# Patient Record
Sex: Female | Born: 2005
Health system: Southern US, Community
[De-identification: ages and names within clinical notes are randomized; demographics above are authoritative.]

## PROBLEM LIST (undated history)

## (undated) DIAGNOSIS — L719 Rosacea, unspecified: Secondary | ICD-10-CM

## (undated) DIAGNOSIS — F4541 Pain disorder exclusively related to psychological factors: Secondary | ICD-10-CM

## (undated) DIAGNOSIS — J45909 Unspecified asthma, uncomplicated: Secondary | ICD-10-CM

## (undated) HISTORY — DX: Pain disorder exclusively related to psychological factors: F45.41

## (undated) HISTORY — DX: Rosacea, unspecified: L71.9

## (undated) HISTORY — DX: Unspecified asthma, uncomplicated: J45.909

---
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
## 2023-01-01 DIAGNOSIS — F411 Generalized anxiety disorder: Secondary | ICD-10-CM | POA: Diagnosis not present

## 2023-01-07 ENCOUNTER — Other Ambulatory Visit (HOSPITAL_COMMUNITY): Payer: Self-pay

## 2023-01-07 MED ORDER — METHYLPHENIDATE HCL ER 18 MG PO TB24
18.0000 mg | ORAL_TABLET | Freq: Every day | ORAL | 0 refills | Status: DC
Start: 2023-01-07 — End: 2023-03-19
  Filled 2023-01-07: qty 30, 30d supply, fill #0

## 2023-01-22 ENCOUNTER — Other Ambulatory Visit (HOSPITAL_COMMUNITY): Payer: Self-pay

## 2023-01-25 DIAGNOSIS — F411 Generalized anxiety disorder: Secondary | ICD-10-CM | POA: Diagnosis not present

## 2023-02-04 ENCOUNTER — Other Ambulatory Visit (HOSPITAL_COMMUNITY): Payer: Self-pay

## 2023-02-08 ENCOUNTER — Other Ambulatory Visit (HOSPITAL_COMMUNITY): Payer: Self-pay

## 2023-02-11 ENCOUNTER — Other Ambulatory Visit: Payer: Self-pay

## 2023-02-26 DIAGNOSIS — F411 Generalized anxiety disorder: Secondary | ICD-10-CM | POA: Diagnosis not present

## 2023-03-12 DIAGNOSIS — F411 Generalized anxiety disorder: Secondary | ICD-10-CM | POA: Diagnosis not present

## 2023-03-18 ENCOUNTER — Other Ambulatory Visit (HOSPITAL_COMMUNITY): Payer: Self-pay

## 2023-03-19 ENCOUNTER — Other Ambulatory Visit (HOSPITAL_COMMUNITY): Payer: Self-pay

## 2023-03-19 ENCOUNTER — Encounter (HOSPITAL_COMMUNITY): Payer: Self-pay

## 2023-03-19 MED ORDER — METHYLPHENIDATE HCL ER (OSM) 18 MG PO TBCR
18.0000 mg | EXTENDED_RELEASE_TABLET | Freq: Every day | ORAL | 0 refills | Status: DC
Start: 2023-03-19 — End: 2023-09-18
  Filled 2023-03-19: qty 30, 30d supply, fill #0

## 2023-03-19 MED ORDER — FLUTICASONE PROPIONATE HFA 44 MCG/ACT IN AERO
1.0000 | INHALATION_SPRAY | Freq: Two times a day (BID) | RESPIRATORY_TRACT | 3 refills | Status: DC
  Filled 2023-03-19: qty 10.6, 60d supply, fill #0

## 2023-03-19 MED ORDER — ALBUTEROL SULFATE (2.5 MG/3ML) 0.083% IN NEBU
3.0000 mL | INHALATION_SOLUTION | RESPIRATORY_TRACT | 2 refills | Status: AC | PRN
  Filled 2023-03-19: qty 300, 17d supply, fill #0

## 2023-03-20 ENCOUNTER — Other Ambulatory Visit (HOSPITAL_COMMUNITY): Payer: Self-pay

## 2023-03-20 MED ORDER — ALBUTEROL SULFATE HFA 108 (90 BASE) MCG/ACT IN AERS
2.0000 | INHALATION_SPRAY | RESPIRATORY_TRACT | 2 refills | Status: AC | PRN
  Filled 2023-03-20: qty 6.7, 17d supply, fill #0

## 2023-03-26 DIAGNOSIS — F411 Generalized anxiety disorder: Secondary | ICD-10-CM | POA: Diagnosis not present

## 2023-04-16 DIAGNOSIS — T8332XA Displacement of intrauterine contraceptive device, initial encounter: Secondary | ICD-10-CM | POA: Diagnosis not present

## 2023-04-16 DIAGNOSIS — N921 Excessive and frequent menstruation with irregular cycle: Secondary | ICD-10-CM | POA: Diagnosis not present

## 2023-04-16 DIAGNOSIS — Z975 Presence of (intrauterine) contraceptive device: Secondary | ICD-10-CM | POA: Diagnosis not present

## 2023-04-23 DIAGNOSIS — F411 Generalized anxiety disorder: Secondary | ICD-10-CM | POA: Diagnosis not present

## 2023-04-30 DIAGNOSIS — F411 Generalized anxiety disorder: Secondary | ICD-10-CM | POA: Diagnosis not present

## 2023-05-07 DIAGNOSIS — F411 Generalized anxiety disorder: Secondary | ICD-10-CM | POA: Diagnosis not present

## 2023-05-10 ENCOUNTER — Other Ambulatory Visit (HOSPITAL_BASED_OUTPATIENT_CLINIC_OR_DEPARTMENT_OTHER): Payer: Self-pay

## 2023-05-11 ENCOUNTER — Other Ambulatory Visit (HOSPITAL_COMMUNITY): Payer: Self-pay

## 2023-05-13 ENCOUNTER — Other Ambulatory Visit (HOSPITAL_COMMUNITY): Payer: Self-pay

## 2023-05-13 MED ORDER — METHYLPHENIDATE HCL ER (OSM) 18 MG PO TBCR
18.0000 mg | EXTENDED_RELEASE_TABLET | Freq: Every day | ORAL | 0 refills | Status: DC
  Filled 2023-05-13: qty 30, 30d supply, fill #0

## 2023-05-14 ENCOUNTER — Other Ambulatory Visit (HOSPITAL_BASED_OUTPATIENT_CLINIC_OR_DEPARTMENT_OTHER): Payer: Self-pay

## 2023-05-14 ENCOUNTER — Other Ambulatory Visit (HOSPITAL_COMMUNITY): Payer: Self-pay

## 2023-05-14 DIAGNOSIS — F411 Generalized anxiety disorder: Secondary | ICD-10-CM | POA: Diagnosis not present

## 2023-06-04 DIAGNOSIS — F411 Generalized anxiety disorder: Secondary | ICD-10-CM | POA: Diagnosis not present

## 2023-06-07 ENCOUNTER — Other Ambulatory Visit (HOSPITAL_BASED_OUTPATIENT_CLINIC_OR_DEPARTMENT_OTHER): Payer: Self-pay

## 2023-06-18 DIAGNOSIS — F411 Generalized anxiety disorder: Secondary | ICD-10-CM | POA: Diagnosis not present

## 2023-07-01 DIAGNOSIS — F411 Generalized anxiety disorder: Secondary | ICD-10-CM | POA: Diagnosis not present

## 2023-07-18 DIAGNOSIS — F411 Generalized anxiety disorder: Secondary | ICD-10-CM | POA: Diagnosis not present

## 2023-07-22 ENCOUNTER — Other Ambulatory Visit (HOSPITAL_COMMUNITY): Payer: Self-pay

## 2023-07-22 ENCOUNTER — Other Ambulatory Visit: Payer: Self-pay

## 2023-07-22 ENCOUNTER — Encounter (HOSPITAL_COMMUNITY): Payer: Self-pay

## 2023-07-22 ENCOUNTER — Ambulatory Visit: Admitting: Internal Medicine

## 2023-07-29 ENCOUNTER — Ambulatory Visit: Admitting: Internal Medicine

## 2023-08-09 DIAGNOSIS — F411 Generalized anxiety disorder: Secondary | ICD-10-CM | POA: Diagnosis not present

## 2023-09-06 ENCOUNTER — Telehealth: Payer: Self-pay | Admitting: Family Medicine

## 2023-09-06 NOTE — Telephone Encounter (Signed)
 Copied from CRM [id number]. Topic: Appointments - Scheduling Inquiry for Clinic >> Sep 03, 2023  4:44 PM Rea C wrote: Reason for CRM: Patient called in inquiring to be scheduled with Dr. Annabelle RONAL Fetters. I asked patient if they have family as an existing patient but stated that their family member works in the [employer], and stated the request to be appointed is a favor from the provider.   Patient contact is [phone] (M).  Appointment scheduled

## 2023-09-17 NOTE — Progress Notes (Unsigned)
 No chief complaint on file.  Patient presents to establish care.     Available prior records reviewed in Epic, CareEverywhere--  05/29/22 Mirena  IUD (req cytotec  prior, failed initial attempt) ADHD (inattentive) and generalized anxiety (noted on psych testing 01/2022) Started on Sertraline  06/2021 ADD meds started 02/2022. 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 Dorita  Therapist: Tinnie Domino Psych testing done by Dr. Loel 01/2022.    Immunization History  Administered Date(s) Administered   DTaP 03/27/2004, 05/30/2004, 08/09/2004, 06/08/2005   HIB (PRP-OMP) 03/27/2004, 05/30/2004, 06/08/2005   Hepatitis A 01/10/2005, 01/09/2006   Hepatitis B 11/17/2003, 03/27/2004, 10/25/2004   IPV 03/27/2004, 05/30/2004, 10/25/2004   Influenza Split 11/14/2004, 12/11/2004, 02/16/2012   Influenza,inj,Quad PF,6+ Mos 02/14/2017   MMR 01/10/2005   Pneumococcal Conjugate-13 03/27/2004, 05/30/2004, 08/09/2004, 06/08/2005   Typhoid Inactivated 07/15/2014   Varicella 01/10/2005      ROS:    PHYSICAL EXAM:  There were no vitals taken for this visit.  Wt Readings from Last 3 Encounters:  05/29/22 99 lb (44.9 kg) (4%, Z= -1.79)*  03/30/22 100 lb 12.8 oz (45.7 kg) (6%, Z= -1.60)*  03/05/22 99 lb 3.2 oz (45 kg) (4%, Z= -1.74)*   * Growth percentiles are based on CDC (Girls, 2-20 Years) data.       ASSESSMENT/PLAN:  If we are going to be prescribing psych meds, need phq9 and gad7.  Please check [facility] for other vaccines, seem to be missing some--  Did she get TdaP Any HPV vaccines? Flu shots? COVID vaccines

## 2023-09-18 ENCOUNTER — Ambulatory Visit: Admitting: Family Medicine

## 2023-09-18 ENCOUNTER — Other Ambulatory Visit (HOSPITAL_COMMUNITY): Payer: Self-pay

## 2023-09-18 VITALS — BP 110/60 | HR 64 | Ht 59.0 in | Wt 113.4 lb

## 2023-09-18 DIAGNOSIS — L719 Rosacea, unspecified: Secondary | ICD-10-CM | POA: Diagnosis not present

## 2023-09-18 DIAGNOSIS — F411 Generalized anxiety disorder: Secondary | ICD-10-CM | POA: Diagnosis not present

## 2023-09-18 DIAGNOSIS — J4520 Mild intermittent asthma, uncomplicated: Secondary | ICD-10-CM | POA: Diagnosis not present

## 2023-09-18 DIAGNOSIS — F909 Attention-deficit hyperactivity disorder, unspecified type: Secondary | ICD-10-CM | POA: Diagnosis not present

## 2023-09-18 DIAGNOSIS — F1290 Cannabis use, unspecified, uncomplicated: Secondary | ICD-10-CM | POA: Diagnosis not present

## 2023-09-18 DIAGNOSIS — L989 Disorder of the skin and subcutaneous tissue, unspecified: Secondary | ICD-10-CM

## 2023-09-18 MED ORDER — METHYLPHENIDATE HCL ER (OSM) 18 MG PO TBCR
18.0000 mg | EXTENDED_RELEASE_TABLET | Freq: Every day | ORAL | 0 refills | Status: DC
  Filled 2023-09-18: qty 90, 90d supply, fill #0

## 2023-09-18 MED ORDER — SERTRALINE HCL 50 MG PO TABS
75.0000 mg | ORAL_TABLET | Freq: Every day | ORAL | 0 refills | Status: DC
  Filled 2023-09-18: qty 135, 90d supply, fill #0

## 2023-09-18 NOTE — Patient Instructions (Addendum)
 We discussed trying to increase the sertraline  to 75 mg daily.  This is taking 1.5 tablets once a day. If you find it is more sedating at the higher dose, switch it to taking it in the evening. Contact us  via MyChart in about 4-6 weeks and let us  know how you are doing on it. If no side effects, but no real improvement in anxiety symptoms, we can further increase to 100 mg (you can double up on the pills, and get a new prescription for 100 mg tablets), and then follow-up with us  when you are home for break. Unfortunately we cannot do virtual visit if you are out of state.  Side effects of the higher dose (other than making you sleepy), can include headache, nausea, upset stomach and worsening anxiety, but is usually short-lived (a week or less).  Use warm compresses to the bumps that develop on the eyelids. Try and avoid touching your eyes, and wash hands frequently. Use antibacterial ointment to the area on the left eyelid, which appears slightly inflamed/irritated.   Try and let the skin of your R leg get some air, to allow things to dry up and heal. There is no evidence of infection. Use antibacterial ointment to the open portions of the skin to prevent infection.

## 2023-09-19 ENCOUNTER — Encounter: Payer: Self-pay | Admitting: Family Medicine

## 2023-09-19 DIAGNOSIS — F411 Generalized anxiety disorder: Secondary | ICD-10-CM | POA: Insufficient documentation

## 2023-09-19 DIAGNOSIS — F909 Attention-deficit hyperactivity disorder, unspecified type: Secondary | ICD-10-CM | POA: Insufficient documentation

## 2023-09-19 DIAGNOSIS — F1290 Cannabis use, unspecified, uncomplicated: Secondary | ICD-10-CM | POA: Insufficient documentation

## 2023-09-27 DIAGNOSIS — F411 Generalized anxiety disorder: Secondary | ICD-10-CM | POA: Diagnosis not present

## 2023-10-11 DIAGNOSIS — F411 Generalized anxiety disorder: Secondary | ICD-10-CM | POA: Diagnosis not present

## 2023-10-22 ENCOUNTER — Other Ambulatory Visit (HOSPITAL_COMMUNITY): Payer: Self-pay

## 2023-10-22 MED ORDER — METRONIDAZOLE 0.75 % EX CREA
TOPICAL_CREAM | CUTANEOUS | 10 refills | Status: AC
  Filled 2023-10-22: qty 45, 30d supply, fill #0

## 2023-10-23 ENCOUNTER — Other Ambulatory Visit (HOSPITAL_COMMUNITY): Payer: Self-pay

## 2023-11-04 DIAGNOSIS — F411 Generalized anxiety disorder: Secondary | ICD-10-CM | POA: Diagnosis not present

## 2023-12-06 DIAGNOSIS — F411 Generalized anxiety disorder: Secondary | ICD-10-CM | POA: Diagnosis not present

## 2023-12-16 ENCOUNTER — Other Ambulatory Visit: Payer: Self-pay | Admitting: Family Medicine

## 2023-12-16 DIAGNOSIS — F909 Attention-deficit hyperactivity disorder, unspecified type: Secondary | ICD-10-CM

## 2023-12-16 DIAGNOSIS — F411 Generalized anxiety disorder: Secondary | ICD-10-CM

## 2023-12-17 ENCOUNTER — Other Ambulatory Visit: Payer: Self-pay

## 2023-12-17 ENCOUNTER — Other Ambulatory Visit (HOSPITAL_COMMUNITY): Payer: Self-pay

## 2023-12-17 MED ORDER — METHYLPHENIDATE HCL ER (OSM) 18 MG PO TBCR
18.0000 mg | EXTENDED_RELEASE_TABLET | Freq: Every day | ORAL | 0 refills | Status: AC
  Filled 2023-12-17 – 2023-12-20 (×2): qty 90, 90d supply, fill #0

## 2023-12-17 MED ORDER — SERTRALINE HCL 50 MG PO TABS
75.0000 mg | ORAL_TABLET | Freq: Every day | ORAL | 0 refills | Status: AC
  Filled 2023-12-17: qty 135, 90d supply, fill #0

## 2023-12-17 NOTE — Telephone Encounter (Signed)
 Spoke with patient and she states that she is doing well on 1.5 tablets and does not need to go up to the 100mg .

## 2023-12-17 NOTE — Telephone Encounter (Signed)
 V--looks like you already approved the sertraline . That was a dose change at her last visit. I was supposed to be hearing from her to know how she is doing on the higher dose (increased from 50 to 75 mg). Please reach out and ask how she is doing on the 1.5 tablets--remind her that we had wanted her to give us  an update 4-6 weeks later after increasing it (assuming that she even went up on the dose).  Methylphenidate  was refilled

## 2023-12-17 NOTE — Telephone Encounter (Signed)
 Is this okay to refill?

## 2023-12-18 ENCOUNTER — Other Ambulatory Visit (HOSPITAL_COMMUNITY): Payer: Self-pay

## 2023-12-20 ENCOUNTER — Other Ambulatory Visit (HOSPITAL_COMMUNITY): Payer: Self-pay

## 2023-12-30 DIAGNOSIS — F411 Generalized anxiety disorder: Secondary | ICD-10-CM | POA: Diagnosis not present

## 2024-01-14 ENCOUNTER — Encounter: Payer: Self-pay | Admitting: *Deleted

## 2024-01-15 NOTE — Progress Notes (Signed)
 Chief Complaint  Patient presents with   Annual Exam    Fasting annual exam. Does not see GYN. Does not need STD screening, has been screened since last partner. Believes that she labs drawn, unsure where/when. Had flu and covid at school. Wants to discuss prevnar with you. [name] getting stretch marks she would to discuss.   Lisa Reid is a 18 y.o. adult who is here for well care, and follow-up on chronic issues.    [name] concerned about weight gain, notes some stretch marks on her thighs. [name] trying to eat better.  Worried about her diet, weight, overall health. Asking about apps to track macros, doesn't want to track calories.  05/29/22 Mirena  IUD (required cytotec  prior, failed initial attempt). She doesn't check her strings. She saw a doctor at school who couldn't visualize the strings, sent her for US  and stated position was normal. She doesn't have a local GYN She has been sexually active with males (and female), uses condoms. No new partners since last STD check (GC/chlamydia, I don't see any documentation of blood tests). She denies pelvic pain. She has some periodic spotting, fairly regularly   ADHD (inattentive) and generalized anxiety (noted on psych testing 01/2022) Started on Sertraline  06/2021. ADD meds started 02/2022.  She didn't take methylphenidate  over the summer.  She got behind in her classes last year when she tried going with the medication. She currently reports that the medication is effective, helps during classes, and she gets her homework done in the afternoons. Doesn't have a need for any short-acting medication for evenings. She reports her grades are good this semester. Denies side effects to medication--no insomnia, tics, weight loss. Last fills 12/20/23 and 09/18/23 for #90.  Anxiety--She had been on 50 mg dose of sertraline  for over a year.  PHQ-9 score was 8 and GAD-7 score was 7 at her visit in August. She had reported using cannabinoid edibles, often  used alone (not socially), to wind down at night. Discussed that trouble relaxing could be related to anxiety, and we had discussed increasing sertraline  dose to 75. She increased dose after her August visit. She feels better on the 75 mg dose.  Moods are also better due to being back at school with her good friends. [name] only using the gummies just once a week, much less.  Asthma--Currently she uses albuterol  prior to exercise, and prn. Uses albuterol  and pulmicort  nebulizer when she is sick. Last spirometry was 02/2018. She hasn't needed any rescue inhaler recently.    Other doctors: Therapist: Tinnie Domino Psych testing done by Dr. Loel 01/2022. Dentist: Caron Edison  Nutrition/Eating Behaviors: this year she is eating more fruits and vegetables. At school-- Breakfast: she has hash brown or other potato, veggie sausage, yogurt with berries when they have it, plus another carb (ie muffin) Lunch: PB&J on whole wheat bread with a banana. Pork once or twice a week, mostly chicken. Favorite vegetable is broccoli, snacks on baby carrots frequently.  She started eating salads this break. Eats a lot of cheese (especially when home).  Calcium in diet: yogurt 4-6x/week. Eats a lot cheese. Milk in her tea.  Doesn't tolerate regular milk. Has access to alternative milks at school, hasn't tried.  Supplements/ Vitamins: none   Immunization History  Administered Date(s) Administered   DTaP 03/27/2004, 05/30/2004, 08/09/2004, 06/08/2005, 09/14/2009   HIB (PRP-OMP) 03/27/2004, 05/30/2004, 06/08/2005   HPV 9-valent 01/17/2015, 07/20/2015   Hepatitis A 01/10/2005, 01/09/2006   Hepatitis A,  Ped/Adol-2 Dose 01/10/2005, 01/10/2006   Hepatitis B 28-Nov-2003, 03/27/2004, 10/25/2004   Hepatitis B, PED/ADOLESCENT 03/19/2003, 03/27/2004, 10/25/2004   IPV 03/27/2004, 05/30/2004, 10/25/2004, 09/14/2009   Influenza Inj Mdck Quad Pf 01/10/2018   Influenza Split 11/14/2004, 12/11/2004, 02/16/2012    Influenza,inj,Quad PF,6+ Mos 02/14/2017   Influenza-Unspecified 03/18/2022, 10/10/2022, 11/21/2023   MMR 01/10/2005, 09/14/2009   MenQuadfi_Meningococcal Groups ACYW Conjugate 01/17/2015, 04/26/2020   Meningococcal B, OMV 04/26/2020, 04/26/2020, 06/09/2021, 06/09/2021   PFIZER(Purple Top)SARS-COV-2 Vaccination 06/25/2019, 07/16/2019, 02/04/2020   Pfizer Fall 2024 Covid-19 Vaccine 6mos thru 19yrs 03/18/2022, 10/10/2022   Pfizer(Comirnaty)Fall Seasonal Vaccine 12 years and older 11/21/2023   Pneumococcal Conjugate-13 03/27/2004, 05/30/2004, 08/09/2004, 06/08/2005   Tdap 01/17/2015   Typhoid Inactivated 07/15/2014   Varicella 01/10/2005, 09/14/2009   Last Pap smear: never Last mammogram: never Last colonoscopy: never Last DEXA: never Dentist: twice a year Ophtho: none Exercise:  varies--better at the beginning of the semester, less during finals. Walks or bikes to class (small campus--no more than 5 minutes).  Lives on 4th floor--taking stairs more frequently. She plays hockey in the winter--10 weeks (practice 2x/week plus games)    PMH, PSH, SH and FH were reviewed and updated  Outpatient Encounter Medications as of 01/16/2024  Medication Sig Note   albuterol  (PROVENTIL ) (2.5 MG/3ML) 0.083% nebulizer solution Take 3 mLs by nebulization every 4 (four) hours as needed for wheezing. 01/16/2024: As needed   albuterol  (VENTOLIN  HFA) 108 (90 Base) MCG/ACT inhaler Inhale 2 puffs into the lungs every 4 to 6 hours as needed for cough/wheeze. 01/16/2024: As needed, prior to working out   budesonide  (PULMICORT ) 0.5 MG/2ML nebulizer solution USE 1 VIAL VIA NEBULIZER ONCE OR TWICE DAILY 01/16/2024: As needed   calcium carbonate (TUMS - DOSED IN MG ELEMENTAL CALCIUM) 500 MG chewable tablet Chew 1 tablet by mouth daily. 01/16/2024: As needed   lactase (LACTAID) 3000 units tablet Take 3,000 Units by mouth 3 (three) times daily with meals. 01/16/2024: As needed   Levonorgestrel  (MIRENA , 52 MG, IU) by  Intrauterine route. 01/16/2024: Inserted 05/2022   methylphenidate  18 MG PO CR tablet Take 1 tablet (18 mg total) by mouth daily.    metroNIDAZOLE  (METROCREAM ) 0.75 % cream Apply on the skin in the morning    sertraline  (ZOLOFT ) 50 MG tablet Take 1&1/2 tablets (75 mg total) by mouth daily.    [DISCONTINUED] albuterol  (PROVENTIL ) (2.5 MG/3ML) 0.083% nebulizer solution INHALE VIA NEBULIZER EVERY 6 HOURS AS NEEDED FOR WHEEZING    beclomethasone (QVAR  REDIHALER) 80 MCG/ACT inhaler Inhale 1 puff into the lungs 2 (two) times daily. (Patient not taking: Reported on 01/16/2024)    QVAR  REDIHALER 80 MCG/ACT inhaler INHALE 1 PUFF BY MOUTH 2 TIMES DAILY (Patient not taking: Reported on 01/16/2024)    No facility-administered encounter medications on file as of 01/16/2024.   No Known Allergies  ROS:  Patient denies anorexia, fever, headaches, vision changes, decreased hearing, URI symptoms, breast concerns, chest pain, palpitations, dizziness, syncope, dyspnea on exertion, cough, swelling, nausea, vomiting, diarrhea, constipation, abdominal pain, melena, hematochezia, indigestion/heartburn, hematuria, incontinence, dysuria, abnormal vaginal bleeding (monthly light spotting, has Mirena ), discharge, odor or itch, genital lesions, joint pains, numbness, tingling, weakness, tremor, suspicious skin lesions, abnormal bleeding/bruising, or enlarged lymph nodes. Weight gain, stretch marks per HPI. Anxiety, improved per HPI See HPI   PHYSICAL EXAM:  BP 104/68   Pulse 76   Ht 4' 11 (1.499 m)   Wt 123 lb 12.8 oz (56.2 kg)   BMI 25.00 kg/m   Wt Readings  from Last 3 Encounters:  01/16/24 123 lb 12.8 oz (56.2 kg)  09/18/23 113 lb 6.4 oz (51.4 kg) (21%, Z= -0.80)*  05/29/22 99 lb (44.9 kg) (4%, Z= -1.79)*   * Growth percentiles are based on CDC (Girls, 2-20 Years) data.    General Appearance:    Alert, cooperative, no distress, appears stated age  Head:    Normocephalic, without obvious abnormality,  atraumatic  Eyes:    PERRL, conjunctiva/corneas clear, EOM's intact, fundi    benign  Ears:    Cerumen bilaterally, limits evaluation of TMs  Nose:   Nares normal, no drainage or sinus tenderness  Throat:   Lips, mucosa, and tongue normal; teeth and gums normal  Neck:   Supple, no lymphadenopathy;  thyroid:  no   enlargement/tenderness/nodules; no carotid   bruit or JVD  Back:    Spine nontender, no curvature, ROM normal, no CVA     tenderness  Lungs:     Clear to auscultation bilaterally without wheezes, rales or     ronchi; respirations unlabored  Chest Wall:    No tenderness or deformity   Heart:    Regular rate and rhythm, S1 and S2 normal, no murmur, rub   or gallop  Breast Exam:    Deferred to GYN  Abdomen:     Soft, non-tender, nondistended, normoactive bowel sounds,    no masses, no hepatosplenomegaly  Genitalia:    Deferred to GYN     Extremities:   No clubbing, cyanosis or edema  Pulses:   2+ and symmetric all extremities  Skin:   Skin color, texture, turgor normal, no rashes or lesions. Skin exam is limited, not changed into gown. Back exam is benign. No stria noted on back/abdomen (thighs not evaluated)  Lymph nodes:   Cervical, supraclavicular, and axillary nodes normal  Neurologic:   CNII-XII intact, normal strength, sensation and gait; reflexes 2+ and symmetric throughout          Psych:   Normal mood, affect, hygiene and grooming.        01/16/2024    3:37 PM 09/18/2023   10:57 AM 09/18/2023   10:53 AM 05/02/2022    8:04 AM 03/05/2022   10:27 AM  Depression screen PHQ 2/9  Decreased Interest 0 1 0 0 0  Down, Depressed, Hopeless 0 0 0 0 0  PHQ - 2 Score 0 1 0 0 0  Altered sleeping 0 1  2 1   Tired, decreased energy 0 3  2 2   Change in appetite 1 0  1 0  Feeling bad or failure about yourself  1 1  1  0  Trouble concentrating 0 1  2 3   Moving slowly or fidgety/restless 0 1  1 3   Suicidal thoughts 0 0     PHQ-9 Score 2 8   9  9    Difficult doing work/chores Not  difficult at all Not difficult at all        Data saved with a previous flowsheet row definition      01/16/2024    3:39 PM 09/18/2023   10:59 AM 05/02/2022    8:04 AM 03/05/2022   10:27 AM  GAD 7 : Generalized Anxiety Score  Nervous, Anxious, on Edge 0 1 3 1   Control/stop worrying 0 1 3 1   Worry too much - different things 0 1 3 1   Trouble relaxing 1 1 2 1   Restless 1 1 3 3   Easily annoyed or irritable 0 1 2 1  Afraid - awful might happen 0 1 2 1   Total GAD 7 Score 2 7 18 9   Anxiety Difficulty Not difficult at all Not difficult at all        ASSESSMENT/PLAN:   Annual physical exam - advised to schedule WWE with GYN, given prior exam not having IUD strings visible - Plan: Pneumococcal conjugate vaccine 20-valent (Prevnar 20), Lipid panel, Comprehensive metabolic panel with GFR, CBC with Differential/Platelet, VITAMIN D  25 Hydroxy (Vit-D Deficiency, Fractures), TSH, HIV Antibody (routine testing w rflx), RPR W/RFLX TO RPR TITER, TREPONEMAL AB, SCREEN AND DIAGNOSIS, Hepatitis C antibody  Attention deficit hyperactivity disorder (ADHD), unspecified ADHD type - doing well on current regimen. No adverse effects  Generalized anxiety disorder - improved with 75 mg sertraline  dose. Cont counseling.  Mild intermittent asthma without complication - [employer]. Since she mainly uses albuterol  preventatively, did not rx today. To let us  know if needing more prn/rescue, and we can rx  Need for pneumococcal 20-valent conjugate vaccination - Plan: Pneumococcal conjugate vaccine 20-valent (Prevnar 20)  Screening for STD (sexually transmitted disease) - no recent partners, but didn't see any serum STD testing. Counseled re: STD prevention, condom use - Plan: HIV Antibody (routine testing w rflx), RPR W/RFLX TO RPR TITER, TREPONEMAL AB, SCREEN AND DIAGNOSIS, Hepatitis C antibody  Medication monitoring encounter - Plan: Comprehensive metabolic panel with GFR, CBC with  Differential/Platelet  Weight gain - reassured weight not unhealthy (BMI 25). Counseled re: healthy, well-rounded diet in detail, as well as exercise recommendations - Plan: TSH  Counseled in detail re: diet--encouraged increased fruits/vegetables, limiting cheese portions. Discussed calcium and vitamin D . She will try non-dairy milk alternatives. Suspect vitamin D  will be low. Discussed MVI vs separate D3 for regular, ongoing use. Reassured re: her weight, not being unhealthy. Discussed stretchmarks, questions answered.  Discussed monthly self breast exams and yearly mammograms; at least 30 minutes of aerobic activity at least 5 days/week and weight-bearing exercise 2x/week; proper sunscreen use reviewed; healthy diet, including goals of calcium and vitamin D  intake and alcohol, tobacco, CBD, safe sex. Handouts given as well. Immunization recommendations discussed, continue yearly flu shots. Prevnar-20 given today (due to asthma). TdaP next year  F/u 6 months for med check, CPE 1 year. Sooner prn.    Annabelle DELENA Fetters, MD

## 2024-01-16 ENCOUNTER — Ambulatory Visit: Payer: Self-pay | Admitting: Family Medicine

## 2024-01-16 ENCOUNTER — Encounter: Payer: Self-pay | Admitting: Family Medicine

## 2024-01-16 VITALS — BP 104/68 | HR 76 | Ht 59.0 in | Wt 123.8 lb

## 2024-01-16 DIAGNOSIS — L719 Rosacea, unspecified: Secondary | ICD-10-CM

## 2024-01-16 DIAGNOSIS — R635 Abnormal weight gain: Secondary | ICD-10-CM | POA: Diagnosis not present

## 2024-01-16 DIAGNOSIS — Z113 Encounter for screening for infections with a predominantly sexual mode of transmission: Secondary | ICD-10-CM

## 2024-01-16 DIAGNOSIS — F411 Generalized anxiety disorder: Secondary | ICD-10-CM

## 2024-01-16 DIAGNOSIS — Z23 Encounter for immunization: Secondary | ICD-10-CM

## 2024-01-16 DIAGNOSIS — F909 Attention-deficit hyperactivity disorder, unspecified type: Secondary | ICD-10-CM

## 2024-01-16 DIAGNOSIS — J4520 Mild intermittent asthma, uncomplicated: Secondary | ICD-10-CM

## 2024-01-16 DIAGNOSIS — Z0000 Encounter for general adult medical examination without abnormal findings: Secondary | ICD-10-CM | POA: Diagnosis not present

## 2024-01-16 DIAGNOSIS — Z5181 Encounter for therapeutic drug level monitoring: Secondary | ICD-10-CM | POA: Diagnosis not present

## 2024-01-16 LAB — LIPID PANEL

## 2024-01-16 NOTE — Patient Instructions (Addendum)
 Let us  know if you find yourself using albuterol  as a rescue inhaler (rather than just preventative, using prior to exercise). We discussed that [employer] might work a little bit better as a doctor, hospital. Let us  know if you would like a prescription sent in.  Please schedule a visit with a gynecologist (given the issues with the strings not being visible per your last doctor).

## 2024-01-17 ENCOUNTER — Encounter: Payer: Self-pay | Admitting: Family Medicine

## 2024-01-17 ENCOUNTER — Ambulatory Visit: Payer: Self-pay | Admitting: Family Medicine

## 2024-01-17 ENCOUNTER — Other Ambulatory Visit (HOSPITAL_COMMUNITY): Payer: Self-pay

## 2024-01-17 DIAGNOSIS — E559 Vitamin D deficiency, unspecified: Secondary | ICD-10-CM | POA: Insufficient documentation

## 2024-01-17 LAB — COMPREHENSIVE METABOLIC PANEL WITH GFR
ALT: 18 IU/L (ref 0–32)
AST: 24 IU/L (ref 0–40)
Albumin: 5.2 g/dL — AB (ref 4.0–5.0)
Alkaline Phosphatase: 66 IU/L (ref 42–106)
BUN/Creatinine Ratio: 18 (ref 9–23)
BUN: 11 mg/dL (ref 6–20)
Bilirubin Total: 0.3 mg/dL (ref 0.0–1.2)
CO2: 22 mmol/L (ref 20–29)
Calcium: 9.6 mg/dL (ref 8.7–10.2)
Chloride: 100 mmol/L (ref 96–106)
Creatinine, Ser: 0.62 mg/dL (ref 0.57–1.00)
Globulin, Total: 2.4 g/dL (ref 1.5–4.5)
Glucose: 77 mg/dL (ref 70–99)
Potassium: 3.7 mmol/L (ref 3.5–5.2)
Sodium: 139 mmol/L (ref 134–144)
Total Protein: 7.6 g/dL (ref 6.0–8.5)
eGFR: 131 mL/min/1.73 (ref >59)

## 2024-01-17 LAB — CBC WITH DIFFERENTIAL/PLATELET
Basophils Absolute: 0.1 x10E3/uL (ref 0.0–0.2)
Basos: 1 %
EOS (ABSOLUTE): 0.1 x10E3/uL (ref 0.0–0.4)
Eos: 2 %
Hematocrit: 44.6 % (ref 34.0–46.6)
Hemoglobin: 14.4 g/dL (ref 11.1–15.9)
Immature Grans (Abs): 0 x10E3/uL (ref 0.0–0.1)
Immature Granulocytes: 0 %
Lymphocytes Absolute: 2 x10E3/uL (ref 0.7–3.1)
Lymphs: 25 %
MCH: 29.1 pg (ref 26.6–33.0)
MCHC: 32.3 g/dL (ref 31.5–35.7)
MCV: 90 fL (ref 79–97)
Monocytes Absolute: 0.5 x10E3/uL (ref 0.1–0.9)
Monocytes: 6 %
Neutrophils Absolute: 5.5 x10E3/uL (ref 1.4–7.0)
Neutrophils: 66 %
Platelets: 304 x10E3/uL (ref 150–450)
RBC: 4.94 x10E6/uL (ref 3.77–5.28)
RDW: 12.6 % (ref 11.7–15.4)
WBC: 8.2 x10E3/uL (ref 3.4–10.8)

## 2024-01-17 LAB — SYPHILIS: RPR W/REFLEX TO RPR TITER AND TREPONEMAL ANTIBODIES, TRADITIONAL SCREENING AND DIAGNOSIS ALGORITHM: RPR Ser Ql: NONREACTIVE

## 2024-01-17 LAB — HEPATITIS C ANTIBODY: Hep C Virus Ab: NONREACTIVE

## 2024-01-17 LAB — TSH: TSH: 1.3 u[IU]/mL (ref 0.450–4.500)

## 2024-01-17 LAB — VITAMIN D 25 HYDROXY (VIT D DEFICIENCY, FRACTURES): Vit D, 25-Hydroxy: 23.3 ng/mL — AB (ref 30.0–100.0)

## 2024-01-17 LAB — LIPID PANEL
Cholesterol, Total: 182 mg/dL (ref 100–199)
HDL: 57 mg/dL (ref >39)
LDL CALC COMMENT:: 3.2 ratio (ref 0.0–4.4)
LDL Chol Calc (NIH): 110 mg/dL — AB (ref 0–99)
Triglycerides: 79 mg/dL (ref 0–149)
VLDL Cholesterol Cal: 15 mg/dL (ref 5–40)

## 2024-01-17 LAB — HIV ANTIBODY (ROUTINE TESTING W REFLEX): HIV Screen 4th Generation wRfx: NONREACTIVE

## 2024-01-17 MED ORDER — VITAMIN D (ERGOCALCIFEROL) 1.25 MG (50000 UNIT) PO CAPS
50000.0000 [IU] | ORAL_CAPSULE | ORAL | 0 refills | Status: AC
  Filled 2024-01-17: qty 12, 84d supply, fill #0

## 2024-01-18 DIAGNOSIS — F411 Generalized anxiety disorder: Secondary | ICD-10-CM | POA: Diagnosis not present

## 2024-01-27 ENCOUNTER — Other Ambulatory Visit (HOSPITAL_COMMUNITY): Payer: Self-pay

## 2024-01-27 MED ORDER — CHLORHEXIDINE GLUCONATE 0.12 % MT SOLN
OROMUCOSAL | 0 refills | Status: AC
  Filled 2024-01-27: qty 473, 15d supply, fill #0
  Filled 2024-01-27: qty 473, 17d supply, fill #0

## 2024-01-27 MED ORDER — HYDROCODONE-ACETAMINOPHEN 5-325 MG PO TABS
1.0000 | ORAL_TABLET | ORAL | 0 refills | Status: AC | PRN
  Filled 2024-01-27 (×2): qty 8, 2d supply, fill #0

## 2024-01-27 MED ORDER — AMOXICILLIN 500 MG PO CAPS
500.0000 mg | ORAL_CAPSULE | Freq: Three times a day (TID) | ORAL | 0 refills | Status: AC
  Filled 2024-01-27 (×2): qty 15, 5d supply, fill #0

## 2024-02-10 DIAGNOSIS — F411 Generalized anxiety disorder: Secondary | ICD-10-CM | POA: Diagnosis not present

## 2024-07-30 ENCOUNTER — Ambulatory Visit: Admitting: Family Medicine

## 2025-01-20 ENCOUNTER — Encounter: Admitting: Family Medicine
